# Patient Record
Sex: Male | Born: 1970 | Hispanic: Yes | Marital: Married | Smoking: Never smoker
Health system: Southern US, Community
[De-identification: ages and names within clinical notes are randomized; demographics above are authoritative.]

## PROBLEM LIST (undated history)

## (undated) DIAGNOSIS — M109 Gout, unspecified: Secondary | ICD-10-CM

## (undated) DIAGNOSIS — N200 Calculus of kidney: Secondary | ICD-10-CM

## (undated) DIAGNOSIS — J3081 Allergic rhinitis due to animal (cat) (dog) hair and dander: Secondary | ICD-10-CM

---

## 2018-06-22 ENCOUNTER — Emergency Department (HOSPITAL_BASED_OUTPATIENT_CLINIC_OR_DEPARTMENT_OTHER)
Admission: EM | Admit: 2018-06-22 | Discharge: 2018-06-22 | Disposition: A | Discharge status: Home / Self Care | Payer: Self-pay | Attending: Emergency Medicine | Admitting: Emergency Medicine

## 2018-06-22 ENCOUNTER — Encounter (HOSPITAL_BASED_OUTPATIENT_CLINIC_OR_DEPARTMENT_OTHER): Payer: Self-pay

## 2018-06-22 ENCOUNTER — Emergency Department (HOSPITAL_BASED_OUTPATIENT_CLINIC_OR_DEPARTMENT_OTHER): Payer: Self-pay

## 2018-06-22 DIAGNOSIS — N201 Calculus of ureter: Secondary | ICD-10-CM

## 2018-06-22 DIAGNOSIS — Z87442 Personal history of urinary calculi: Secondary | ICD-10-CM | POA: Insufficient documentation

## 2018-06-22 DIAGNOSIS — K760 Fatty (change of) liver, not elsewhere classified: Secondary | ICD-10-CM | POA: Insufficient documentation

## 2018-06-22 DIAGNOSIS — N132 Hydronephrosis with renal and ureteral calculous obstruction: Secondary | ICD-10-CM | POA: Insufficient documentation

## 2018-06-22 HISTORY — DX: Calculus of kidney: N20.0

## 2018-06-22 HISTORY — DX: Gout, unspecified: M10.9

## 2018-06-22 LAB — COMPREHENSIVE METABOLIC PANEL
ALK PHOS: 74 U/L (ref 38–126)
ALT: 27 U/L (ref 0–44)
AST: 27 U/L (ref 15–41)
Albumin: 4.2 g/dL (ref 3.5–5.0)
Anion gap: 11 (ref 5–15)
BILIRUBIN TOTAL: 0.7 mg/dL (ref 0.3–1.2)
BUN: 16 mg/dL (ref 6–20)
CALCIUM: 9.4 mg/dL (ref 8.9–10.3)
CO2: 25 mmol/L (ref 22–32)
CREATININE: 1.33 mg/dL — AB (ref 0.61–1.24)
Chloride: 104 mmol/L (ref 98–111)
Glucose, Bld: 125 mg/dL — ABNORMAL HIGH (ref 70–99)
Potassium: 4.1 mmol/L (ref 3.5–5.1)
Sodium: 140 mmol/L (ref 135–145)
Total Protein: 7.6 g/dL (ref 6.5–8.1)

## 2018-06-22 LAB — URINALYSIS, ROUTINE W REFLEX MICROSCOPIC
BILIRUBIN URINE: NEGATIVE
Glucose, UA: NEGATIVE mg/dL
KETONES UR: NEGATIVE mg/dL
Leukocytes, UA: NEGATIVE
Nitrite: NEGATIVE
Protein, ur: NEGATIVE mg/dL
SPECIFIC GRAVITY, URINE: 1.025 (ref 1.005–1.030)
pH: 5.5 (ref 5.0–8.0)

## 2018-06-22 LAB — CBC WITH DIFFERENTIAL/PLATELET
BASOS ABS: 0 10*3/uL (ref 0.0–0.1)
Basophils Relative: 0 %
EOS PCT: 2 %
Eosinophils Absolute: 0.2 10*3/uL (ref 0.0–0.7)
HEMATOCRIT: 47.1 % (ref 39.0–52.0)
HEMOGLOBIN: 16.7 g/dL (ref 13.0–17.0)
LYMPHS ABS: 1.1 10*3/uL (ref 0.7–4.0)
LYMPHS PCT: 14 %
MCH: 31 pg (ref 26.0–34.0)
MCHC: 35.5 g/dL (ref 30.0–36.0)
MCV: 87.4 fL (ref 78.0–100.0)
Monocytes Absolute: 0.7 10*3/uL (ref 0.1–1.0)
Monocytes Relative: 9 %
NEUTROS ABS: 6 10*3/uL (ref 1.7–7.7)
Neutrophils Relative %: 75 %
Platelets: 238 10*3/uL (ref 150–400)
RBC: 5.39 MIL/uL (ref 4.22–5.81)
RDW: 12.6 % (ref 11.5–15.5)
WBC: 8 10*3/uL (ref 4.0–10.5)

## 2018-06-22 LAB — URINALYSIS, MICROSCOPIC (REFLEX)

## 2018-06-22 MED ORDER — ONDANSETRON 4 MG PO TBDP: 4.0000 mg | ORAL_TABLET | Freq: Three times a day (TID) | ORAL | 0 refills | Status: DC | PRN

## 2018-06-22 MED ORDER — HYDROCODONE-ACETAMINOPHEN 5-325 MG PO TABS: 2.0000 | ORAL_TABLET | ORAL | 0 refills | Status: DC | PRN

## 2018-06-22 MED ORDER — ONDANSETRON HCL 4 MG/2ML IJ SOLN
4.0000 mg | Freq: Once | INTRAMUSCULAR | Status: DC
  Filled 2018-06-22: qty 2

## 2018-06-22 MED ORDER — OXYCODONE-ACETAMINOPHEN 5-325 MG PO TABS
1.0000 | ORAL_TABLET | Freq: Once | ORAL | Status: AC
  Administered 2018-06-22: 1 via ORAL
  Filled 2018-06-22: qty 1

## 2018-06-22 MED ORDER — TAMSULOSIN HCL 0.4 MG PO CAPS: 0.4000 mg | ORAL_CAPSULE | Freq: Every day | ORAL | 0 refills | Status: DC

## 2018-06-22 MED ORDER — ALLOPURINOL 300 MG PO TABS: 300.0000 mg | ORAL_TABLET | Freq: Every day | ORAL | 0 refills | Status: DC

## 2018-06-22 MED ORDER — SODIUM CHLORIDE 0.9 % IV BOLUS
1000.0000 mL | Freq: Once | INTRAVENOUS | Status: AC
  Administered 2018-06-22: 1000 mL via INTRAVENOUS

## 2018-06-22 MED FILL — ALLOPURINOL 300 MG TABLET: 300 | 30 days supply | Qty: 30 | Fill #0

## 2018-06-22 MED FILL — HYDROCODON-APAP 5-325: 5-325 | 2 days supply | Qty: 8 | Fill #0

## 2018-06-22 MED FILL — ONDANSETRON ODT 4 MG TABLET: 4 | 2 days supply | Qty: 6 | Fill #0

## 2018-06-22 MED FILL — TAMSULOSIN HCL 0.4 MG CAP: 0.4 | 14 days supply | Qty: 14 | Fill #0

## 2018-06-22 NOTE — ED Triage Notes (Signed)
Pt c/o rt flank pain with nausea x2hrs, hx of kidney stones

## 2018-06-22 NOTE — Discharge Instructions (Addendum)
You do have a 4.6 mm kidney stone the right side.  This is likely causing her pain.  Have given you Zofran for nausea.  Have given you stronger pain medication to take however be careful to take the medication.  He also takes Flomax daily.  Have discussed that you have concern for a fatty liver that needs to be followed up with your primary care doctor.  Your kidney function was mildly elevated.  Have this rechecked by your primary care doctor.  Drink plenty of fluids stay hydrated.  Follow-up with urologist for the kidney stone return the ED with any worsening symptoms.  You have been diagnosed with kidney stones.  Drink plenty of fluids to help you pass the stone.  Use your pain medication as directed and only as needed for severe pain. Taking flomax as directed will also help to pass the stone. Use Zofran for nausea as directed.  Follow up with the urology clinic listed in regards to your hospital visit.   Return to the ED immediately if you develop fever that persists > 101, uncontrolled pain or vomiting, or other concerns.   Do not drink alcohol, drive or participate in any other potentially dangerous activities while taking opiate pain medication as it may make you sleepy. Do not take this medication with any other sedating medications, either prescription or over-the-counter. If you were prescribed Percocet or Vicodin, do not take these with acetaminophen (Tylenol) as it is already contained within these medications.   This medication is an opiate (or narcotic) pain medication and can be habit forming.  Use it as little as possible to achieve adequate pain control.  Do not use or use it with extreme caution if you have a history of opiate abuse or dependence. This medication is intended for your use only - do not give any to anyone else and keep it in a secure place where nobody else, especially children, have access to it. It will also cause or worsen constipation, so you may want to consider  taking an over-the-counter stool softener while you are taking this medication.

## 2018-06-22 NOTE — ED Provider Notes (Signed)
[facility] EMERGENCY DEPARTMENT Provider Note   CSN: [id number] Arrival date & time: 06/22/18  8295     History   Chief Complaint Chief Complaint  Patient presents with  . Flank Pain    HPI Nathan Hahn is a 47 y.o. male.  HPI 47 year old male past medical history significant for gout, kidney stones presents to the emergency department today for evaluation of right flank pain.  Patient states her department 2 hours ago he woke up with pain to the right side.  He states that radiates to his right lower quadrant.  Patient states the pain is a sharp throbbing sensation.  He states the pain was initially a 6/10 and now has decreased to a 3/10.  The pain feels similar to prior kidney stone.  Patient reports nausea but denies any emesis.  Denies any fevers or chills.  Denies any urinary symptoms.  Denies any change in bowel habits.  He has not taken anything for his pain prior to arrival.  Nothing makes better or worse.  Last kidney stone was in 2018.  Pt denies any fever, chill, ha, vision changes, lightheadedness, dizziness, congestion, neck pain, cp, sob, cough,  urinary symptoms, change in bowel habits, melena, hematochezia, lower extremity paresthesias.  Past Medical History:  Diagnosis Date  . Gout   . Kidney stones     There are no active problems to display for this patient.   History reviewed. No pertinent surgical history.      Home Medications    Prior to Admission medications   Not on File    Family History No family history on file.  Social History Social History   Tobacco Use  . Smoking status: Never Smoker  . Smokeless tobacco: Never Used  Substance Use Topics  . Alcohol use: Not Currently  . Drug use: Not Currently     Allergies   Patient has no known allergies.   Review of Systems Review of Systems  All other systems reviewed and are negative.    Physical Exam Updated Vital Signs BP 132/76 (BP Location: Left Arm)   Pulse  62   Temp 97.6 F (36.4 C) (Oral)   Resp 18   Ht 5\' 5"  (1.651 m)   Wt 90.7 kg   SpO2 100%   BMI 33.28 kg/m   Physical Exam  Constitutional: He appears well-developed and well-nourished. No distress.  HENT:  Head: Normocephalic and atraumatic.  Eyes: Right eye exhibits no discharge. Left eye exhibits no discharge. No scleral icterus.  Neck: Normal range of motion.  Cardiovascular: Normal rate, regular rhythm and normal heart sounds.  Pulmonary/Chest: Effort normal and breath sounds normal. No respiratory distress.  Abdominal: Soft. Normal appearance and bowel sounds are normal. There is no tenderness. There is no rigidity, no rebound, no guarding, no CVA tenderness, no tenderness at McBurney's point and negative Murphy's sign.  Musculoskeletal: Normal range of motion.  Neurological: He is alert.  Skin: Skin is warm and dry. Capillary refill takes less than 2 seconds. No pallor.  Psychiatric: His behavior is normal. Judgment and thought content normal.  Nursing note and vitals reviewed.    ED Treatments / Results  Labs (all labs ordered are listed, but only abnormal results are displayed) Labs Reviewed  COMPREHENSIVE METABOLIC PANEL - Abnormal; Notable for the following components:      Result Value   Glucose, Bld 125 (*)    Creatinine, Ser 1.33 (*)    All other components within normal limits  URINALYSIS, ROUTINE W REFLEX MICROSCOPIC - Abnormal; Notable for the following components:   Hgb urine dipstick LARGE (*)    All other components within normal limits  URINALYSIS, MICROSCOPIC (REFLEX) - Abnormal; Notable for the following components:   Bacteria, UA FEW (*)    All other components within normal limits  CBC WITH DIFFERENTIAL/PLATELET    EKG None  Radiology No results found.  Procedures Procedures (including critical care time)  Medications Ordered in ED Medications  sodium chloride 0.9 % bolus 1,000 mL (1,000 mLs Intravenous New Bag/Given 06/22/18 1043)    ondansetron (ZOFRAN) injection 4 mg (4 mg Intravenous Refused 06/22/18 1044)     Initial Impression / Assessment and Plan / ED Course  I have reviewed the triage vital signs and the nursing notes.  Pertinent labs & imaging results that were available during my care of the patient were reviewed by me and considered in my medical decision making (see chart for details).     Patient presents the ED for evaluation of right flank pain with history of kidney stones.  Denies any other associated symptoms.  On exam patient overall well-appearing and nontoxic.  He states that his pain has subsided.  Vital signs are reassuring.  Patient is afebrile.  Exam reveals no focal abdominal tenderness or CVA tenderness.  Lab work shows no leukocytosis.  Mild elevation in creatinine of 1.3.  Otherwise lab work reassuring.  No significant elect light derangement and normal liver enzymes.  UA shows no concern for infection.  CT scan was obtained that shows a 4.6 mm distal kidney stone.  This is causing some mild hydronephrosis.  Also notes to nephrolithiasis in the left kidney and fatty liver.  Patient's pain managed in the ED.  He has refused any pain medicine or nausea medicine at this time.  Tolerating p.o. fluids.  He is nontoxic or septic appearing.  Vital signs remained reassuring.  Patient be prescribed pain medication, Zofran and Flomax for home use.  Will be given urology follow-up.  Patient has a history of gout has asked for refill of his allopurinol.  Discussed that patient has a mild elevation of his creatinine.  Have discussed with patient caution of significant moderate NSAIDs.  Discussed that patient is to follow-up with primary care doctor concerning other findings on CT scan.  Pt is hemodynamically stable, in NAD, & able to ambulate in the ED. Evaluation does not show pathology that would require ongoing emergent intervention or inpatient treatment. I explained the diagnosis to the patient. Pain has been  managed & has no complaints prior to dc. Pt is comfortable with above plan and is stable for discharge at this time. All questions were answered prior to disposition. Strict return precautions for f/u to the ED were discussed. Encouraged follow up with PCP.    Final Clinical Impressions(s) / ED Diagnoses   Final diagnoses:  Ureterolithiasis  Fatty liver    ED Discharge Orders         Ordered    ondansetron (ZOFRAN ODT) 4 MG disintegrating tablet  Every 8 hours PRN     06/22/18 1144    HYDROcodone-acetaminophen (NORCO/VICODIN) 5-325 MG tablet  Every 4 hours PRN     06/22/18 1144    tamsulosin (FLOMAX) 0.4 MG CAPS capsule  Daily after breakfast     06/22/18 1144    allopurinol (ZYLOPRIM) 300 MG tablet  Daily     06/22/18 1144           Denell Cothern,  Lynann Beaver, PA-C 06/22/18 1150    Sabas Sous, MD 06/22/18 1728

## 2018-09-10 ENCOUNTER — Encounter: Payer: Self-pay | Admitting: Emergency Medicine

## 2018-09-10 ENCOUNTER — Ambulatory Visit
Admission: EM | Admit: 2018-09-10 | Discharge: 2018-09-10 | Disposition: A | Discharge status: Home / Self Care | Payer: Self-pay | Attending: Emergency Medicine | Admitting: Emergency Medicine

## 2018-09-10 DIAGNOSIS — M10071 Idiopathic gout, right ankle and foot: Secondary | ICD-10-CM | POA: Insufficient documentation

## 2018-09-10 MED ORDER — INDOMETHACIN 50 MG PO CAPS: 50.0000 mg | ORAL_CAPSULE | Freq: Two times a day (BID) | ORAL | 0 refills | Status: DC

## 2018-09-10 MED ORDER — PREDNISONE 50 MG PO TABS: 50.0000 mg | ORAL_TABLET | Freq: Every day | ORAL | 0 refills | Status: DC

## 2018-09-10 NOTE — ED Provider Notes (Signed)
[name]    CSN: [id number] Arrival date & time: 09/10/18  1643     History   Chief Complaint Chief Complaint  Patient presents with  . Ankle Pain    HPI Marshell Dilauro is a 47 y.o. male history of gout and kidney stones presenting today for evaluation of a Possible gout flare.  Patient has had right ankle pain for the past week.  It has been painful, swollen and warm.  Denies any injury.  Feels similar to previous flare.  He had a flare approximately 1 month ago symptoms pretty much resolved, but he still had some minor tingling in his ankle.  He was taking indomethacin for this, ran out of this on Friday just prior to the symptoms starting.  He was prescribed a course of allopurinol, but he has not started this as his symptoms have not fully resolved.  Denies numbness or tingling.  HPI  Past Medical History:  Diagnosis Date  . Gout   . Kidney stones     There are no active problems to display for this patient.   History reviewed. No pertinent surgical history.     Home Medications    Prior to Admission medications   Medication Sig Start Date End Date Taking? Authorizing Provider  allopurinol (ZYLOPRIM) 300 MG tablet Take 1 tablet (300 mg total) by mouth daily. 06/22/18   Rise Mu, PA-C  HYDROcodone-acetaminophen (NORCO/VICODIN) 5-325 MG tablet Take 2 tablets by mouth every 4 (four) hours as needed. 06/22/18   Rise Mu, PA-C  indomethacin (INDOCIN) 50 MG capsule Take 1 capsule (50 mg total) by mouth 2 (two) times daily with a meal. 09/10/18   Wieters, Hallie C, PA-C  predniSONE (DELTASONE) 50 MG tablet Take 1 tablet (50 mg total) by mouth daily. With food 09/10/18   Wieters, Junius Creamer, PA-C    Family History History reviewed. No pertinent family history.  Social History Social History   Tobacco Use  . Smoking status: Never Smoker  . Smokeless tobacco: Never Used  Substance Use Topics  . Alcohol use: Not Currently  . Drug  use: Not Currently     Allergies   Patient has no known allergies.   Review of Systems Review of Systems  Constitutional: Negative for fatigue and fever.  Eyes: Negative for redness, itching and visual disturbance.  Respiratory: Negative for shortness of breath.   Cardiovascular: Negative for chest pain and leg swelling.  Gastrointestinal: Negative for nausea and vomiting.  Musculoskeletal: Positive for arthralgias. Negative for myalgias.  Skin: Positive for color change. Negative for rash and wound.  Neurological: Negative for dizziness, syncope, weakness, light-headedness and headaches.     Physical Exam Triage Vital Signs ED Triage Vitals  Enc Vitals Group     BP 09/10/18 1657 119/70     Pulse Rate 09/10/18 1657 95     Resp 09/10/18 1657 18     Temp 09/10/18 1657 98.6 F (37 C)     Temp Source 09/10/18 1657 Oral     SpO2 09/10/18 1657 96 %     Weight --      Height --      Head Circumference --      Peak Flow --      Pain Score 09/10/18 1658 7     Pain Loc --      Pain Edu? --      Excl. in GC? --    No data found.  Updated Vital Signs BP  119/70 (BP Location: Left Arm)   Pulse 95   Temp 98.6 F (37 C) (Oral)   Resp 18   SpO2 96%   Visual Acuity Right Eye Distance:   Left Eye Distance:   Bilateral Distance:    Right Eye Near:   Left Eye Near:    Bilateral Near:     Physical Exam Vitals signs and nursing note reviewed.  Constitutional:      Appearance: He is well-developed.     Comments: No acute distress  HENT:     Head: Normocephalic and atraumatic.     Nose: Nose normal.  Eyes:     Conjunctiva/sclera: Conjunctivae normal.  Neck:     Musculoskeletal: Neck supple.  Cardiovascular:     Rate and Rhythm: Normal rate.  Pulmonary:     Effort: Pulmonary effort is normal. No respiratory distress.  Abdominal:     General: There is no distension.  Musculoskeletal: Normal range of motion.     Comments: Right ankle: Swollen, warm to touch, mild  erythema, relatively full range of motion, slightly limited due to pain, dorsalis pedis 2+, sensation intact distally  No calf swelling erythema or tenderness, negative Homans  Skin:    General: Skin is warm and dry.  Neurological:     Mental Status: He is alert and oriented to person, place, and time.      UC Treatments / Results  Labs (all labs ordered are listed, but only abnormal results are displayed) Labs Reviewed - No data to display  EKG None  Radiology No results found.  Procedures Procedures (including critical care time)  Medications Ordered in UC Medications - No data to display  Initial Impression / Assessment and Plan / UC Course  I have reviewed the triage vital signs and the nursing notes.  Pertinent labs & imaging results that were available during my care of the patient were reviewed by me and considered in my medical decision making (see chart for details).     Patient with likely gout flare.  Given patient has been using indomethacin to treat his gout without full resolution will provide prednisone as alternative.  Will refill indomethacin to use supplementally as needed.  Discussed increased risk of GI bleed with prednisone and indomethacin use together.  Recommended to take with food.  May begin allopurinol when symptoms fully resolved.  Continue to monitor,Discussed strict return precautions. Patient verbalized understanding and is agreeable with plan.  Final Clinical Impressions(s) / UC Diagnoses   Final diagnoses:  Acute idiopathic gout of right ankle     Discharge Instructions     Please begin taking prednisone daily for the next 5 days; take in the morning with food You may supplement with indomethacin up to twice daily, please also take with food on her stomach Taking both of these medicines together may increase the risk of GI bleed You may start allopurinol after symptoms resolve Please follow-up if pain, swelling, redness not resolving  with treatment   ED Prescriptions    Medication Sig Dispense Auth. Provider   predniSONE (DELTASONE) 50 MG tablet  (Status: Discontinued) Take 1 tablet (50 mg total) by mouth daily. With food 5 tablet Wieters, Hallie C, PA-C   indomethacin (INDOCIN) 50 MG capsule  (Status: Discontinued) Take 1 capsule (50 mg total) by mouth 2 (two) times daily with a meal. 90 capsule Wieters, Hallie C, PA-C   indomethacin (INDOCIN) 50 MG capsule Take 1 capsule (50 mg total) by mouth 2 (two) times daily  with a meal. 90 capsule Wieters, Hallie C, PA-C   predniSONE (DELTASONE) 50 MG tablet Take 1 tablet (50 mg total) by mouth daily. With food 5 tablet Wieters, ChaunceyHallie C, PA-C     Controlled Substance Prescriptions [place] Controlled Substance Registry consulted? Not Applicable   Lew DawesWieters, Hallie C, [place]PA-C 09/10/18 1724

## 2018-09-10 NOTE — Discharge Instructions (Signed)
Please begin taking prednisone daily for the next 5 days; take in the morning with food You may supplement with indomethacin up to twice daily, please also take with food on her stomach Taking both of these medicines together may increase the risk of GI bleed You may start allopurinol after symptoms resolve Please follow-up if pain, swelling, redness not resolving with treatment

## 2018-09-10 NOTE — ED Notes (Signed)
Patient able to ambulate independently, gait steady but limping

## 2018-09-10 NOTE — ED Triage Notes (Signed)
[facility], redness and pain to the right ankle.  Pt has been on preventative for Gout but ran out Friday.

## 2019-05-22 ENCOUNTER — Other Ambulatory Visit: Payer: Self-pay

## 2019-05-22 ENCOUNTER — Encounter (HOSPITAL_COMMUNITY): Payer: Self-pay | Admitting: Emergency Medicine

## 2019-05-22 ENCOUNTER — Emergency Department (HOSPITAL_COMMUNITY)
Admission: EM | Admit: 2019-05-22 | Discharge: 2019-05-23 | Disposition: A | Discharge status: Home / Self Care | Payer: BC Managed Care – PPO | Attending: Emergency Medicine | Admitting: Emergency Medicine

## 2019-05-22 DIAGNOSIS — Z5321 Procedure and treatment not carried out due to patient leaving prior to being seen by health care provider: Secondary | ICD-10-CM | POA: Insufficient documentation

## 2019-05-22 DIAGNOSIS — M25561 Pain in right knee: Secondary | ICD-10-CM | POA: Insufficient documentation

## 2019-05-22 NOTE — ED Triage Notes (Signed)
Pt presents with right knee pain that started 2 days ago pt denies any injury concerned for gout in knee.

## 2019-05-23 NOTE — ED Notes (Signed)
No answer when called for room 

## 2019-09-26 IMAGING — CT CT RENAL STONE PROTOCOL
2 of 4 series · 16 of 46 positions shown, 18 images · non-contrast
Comparison: None.

CLINICAL DATA: 47-year-old male with right flank pain and nausea
for 2 hours. History kidney stones. Initial encounter.

EXAM:
CT ABDOMEN AND PELVIS WITHOUT CONTRAST
TECHNIQUE: Multidetector CT imaging of the abdomen and pelvis was performed
following the standard protocol without IV contrast.

[Series 2: axial st · axial · 0.91mm/px · z∈[-628,-158]mm · 13 of 104 slices shown, 15 images]
[im 5/104  soft-tissue]
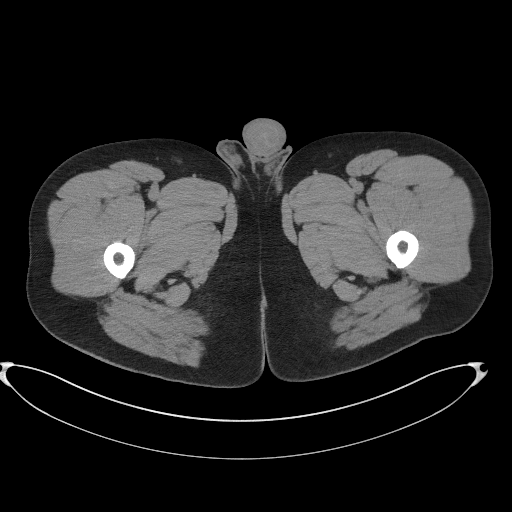
[im 5/104  bone]
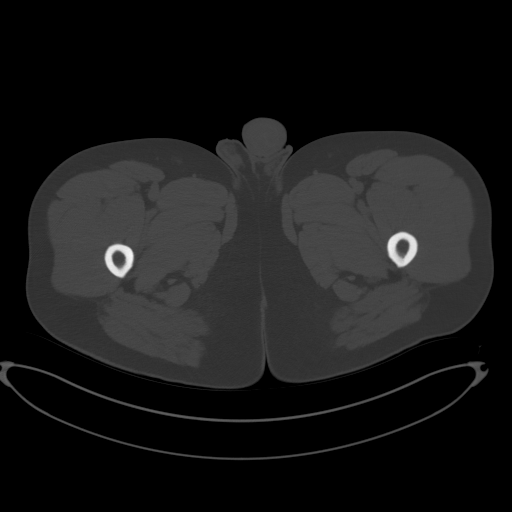
[im 13/104  soft-tissue]
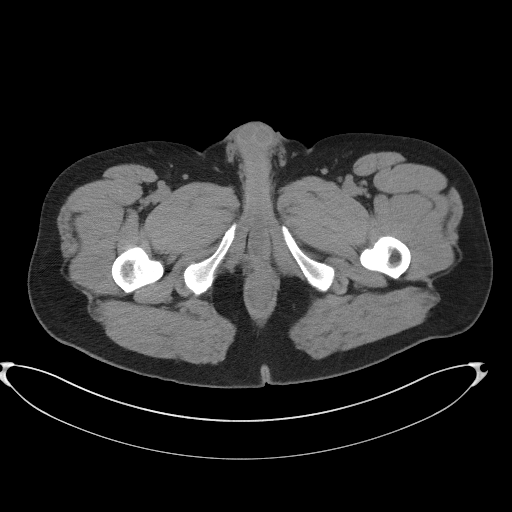
[im 22/104  soft-tissue]
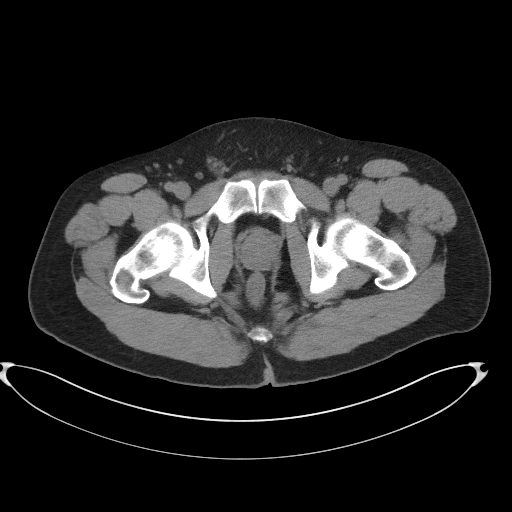
[im 31/104  soft-tissue]
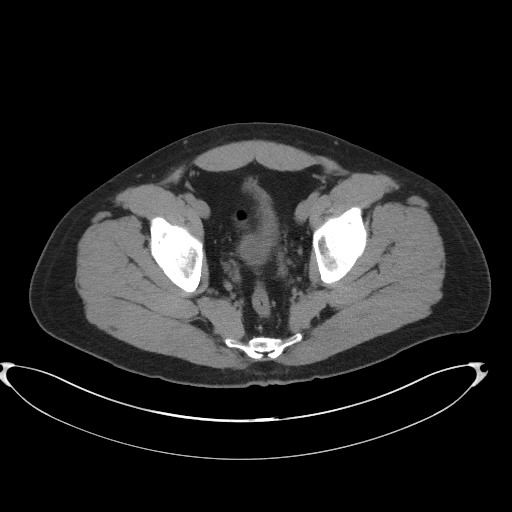
[im 35/104  soft-tissue]
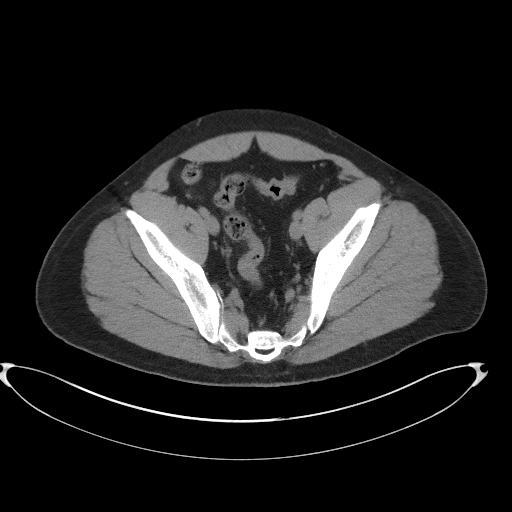
[im 43/104  soft-tissue]
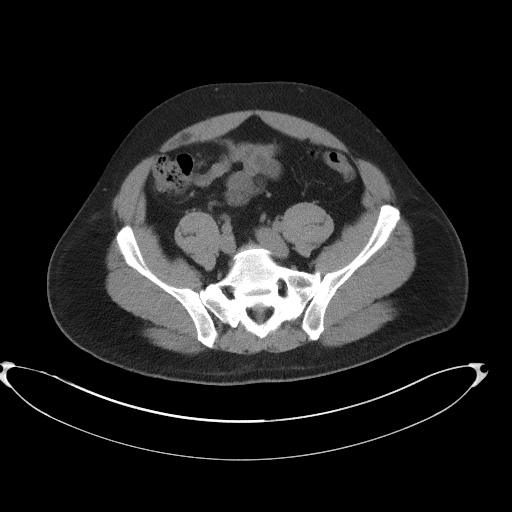
[im 52/104  soft-tissue]
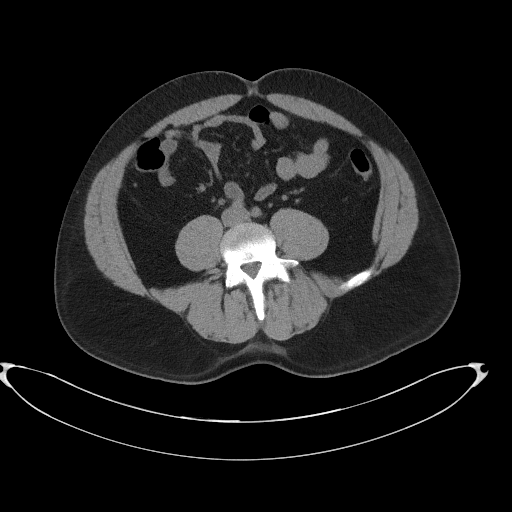
[im 61/104  soft-tissue]
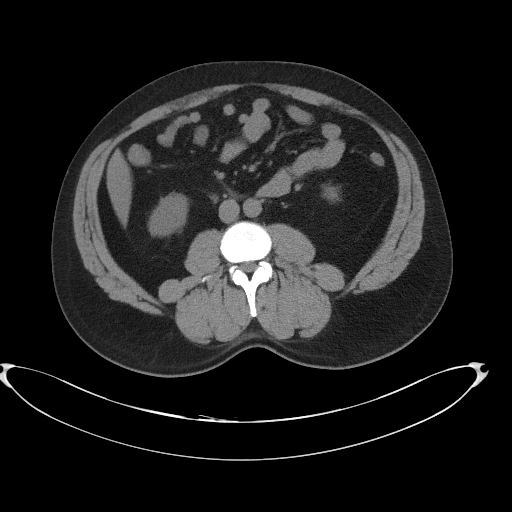
[im 69/104  soft-tissue]
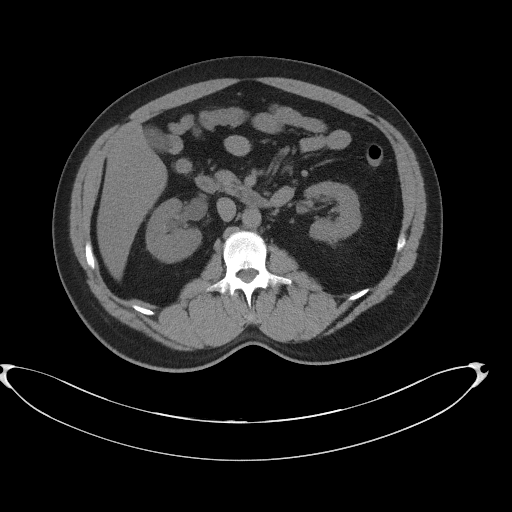
[im 69/104  bone]
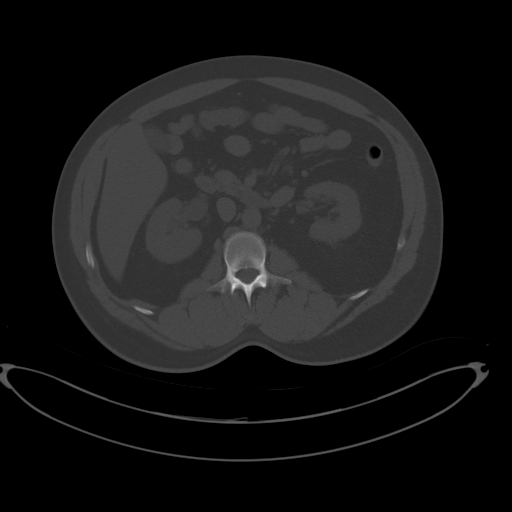
[im 73/104  soft-tissue]
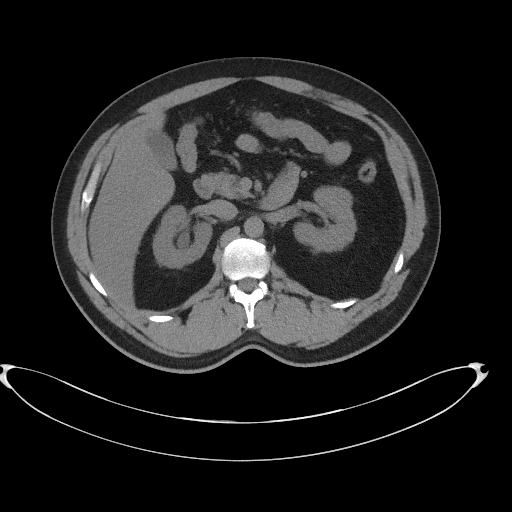
[im 82/104  soft-tissue]
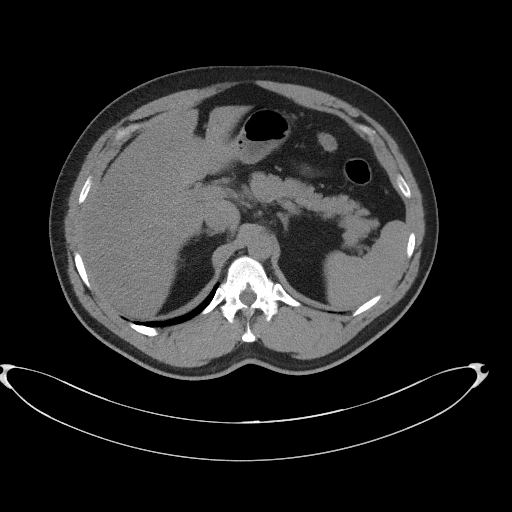
[im 91/104  soft-tissue]
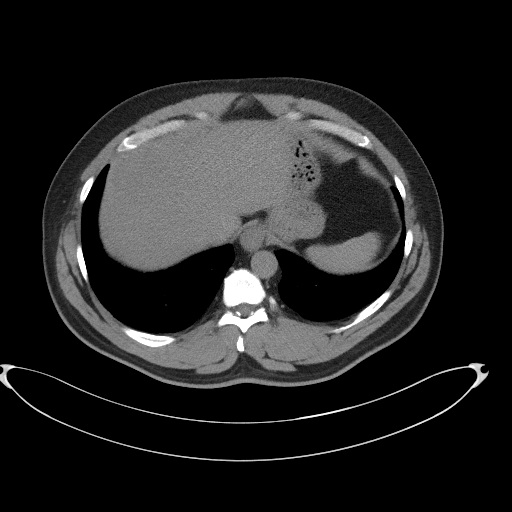
[im 99/104  soft-tissue]
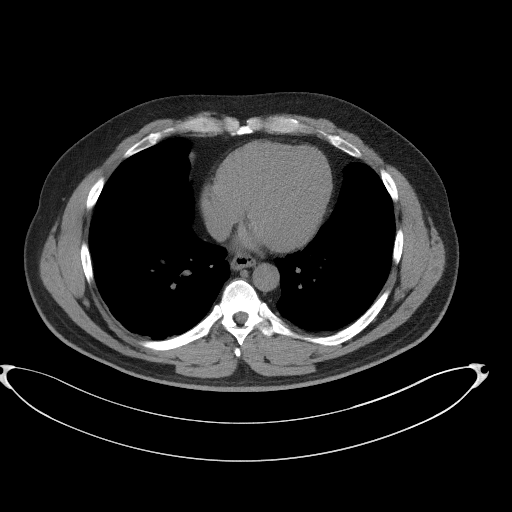

[Series 4: coronal st · coronal · 0.92mm/px · 3 of 112 slices shown]
[im 38/112  soft-tissue]
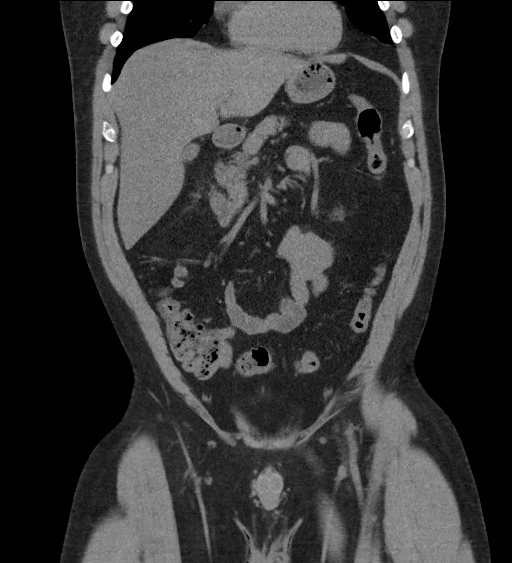
[im 50/112  soft-tissue]
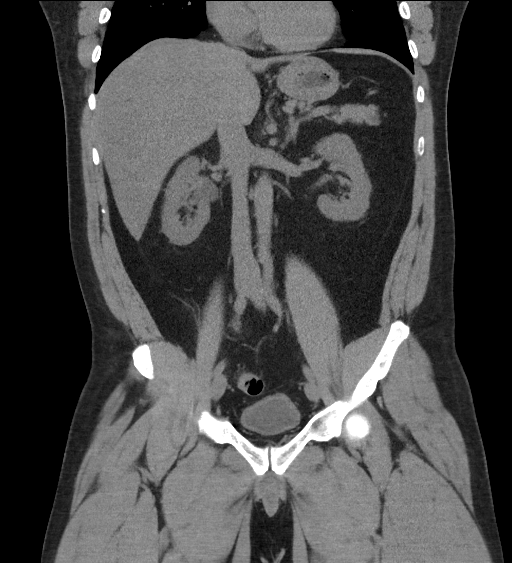
[im 62/112  soft-tissue]
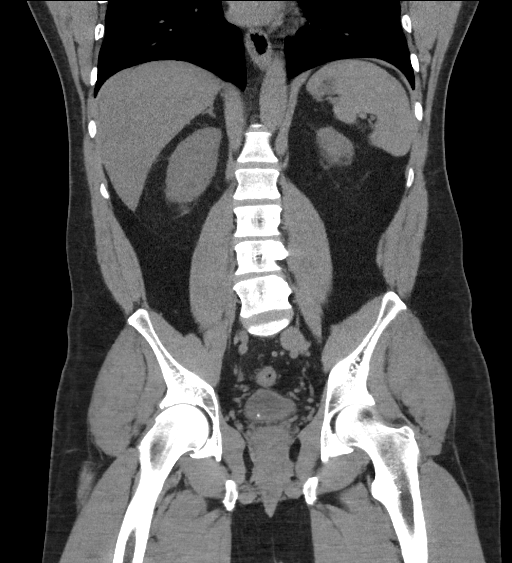

[16 of 46 positions shown; findings below may reference images not displayed]

FINDINGS: Lower chest: Minimal basilar atelectasis. Heart size within normal
limits.

Hepatobiliary: Enlarged fatty liver spanning over 19.8 cm. Taking
into account limitation by non contrast imaging, no worrisome
hepatic lesion. Gallbladder sludge without calcified gallstone. No
CT evidence of gallbladder inflammation.

Pancreas: Taking into account limitation by non contrast imaging, no
worrisome pancreatic mass or inflammation.

Spleen: Taking into account limitation by non contrast imaging, no
splenic mass or enlargement.

Adrenals/Urinary Tract: 4.6 mm right ureteral vesicle junction
obstructing stone with moderate right-sided hydroureteronephrosis. 2
mm nonobstructing left lower pole renal calculus. Taking into
account limitation by non contrast imaging, no worrisome renal,
urinary bladder or adrenal lesion.

Stomach/Bowel: No extraluminal bowel inflammatory process.
Evaluation of distal esophagus, stomach, portions of small and large
bowel limited by under distension.

Vascular/Lymphatic: No abdominal aortic aneurysm. Scattered normal
size lymph nodes.

Reproductive: Negative

Other: No free air or bowel containing hernia.

Musculoskeletal: No worrisome osseous lesion. Transitional vertebra
lumbosacral junction. Slight curvature lower lumbar spine convex
right.
IMPRESSION: 1. 4.6 mm right ureteral vesicle junction obstructing stone with
moderate right-sided hydroureteronephrosis.
2. 2 mm nonobstructing left lower pole renal calculus.
3. Enlarged fatty liver spanning over 19.8 cm.

## 2020-10-30 ENCOUNTER — Other Ambulatory Visit: Payer: Self-pay

## 2020-10-30 ENCOUNTER — Ambulatory Visit
Admission: EM | Admit: 2020-10-30 | Discharge: 2020-10-30 | Disposition: A | Discharge status: Home / Self Care | Payer: BC Managed Care – PPO | Attending: Family Medicine | Admitting: Family Medicine

## 2020-10-30 DIAGNOSIS — G510 Bell's palsy: Secondary | ICD-10-CM

## 2020-10-30 MED ORDER — PREDNISONE 20 MG PO TABS: ORAL_TABLET | ORAL | 0 refills | Status: DC

## 2020-10-30 MED ORDER — CVS LUBRICANT EYE DROPS 0.25 % OP SOLN: 2.0000 [drp] | OPHTHALMIC | 0 refills | Status: DC | PRN

## 2020-10-30 NOTE — ED Provider Notes (Signed)
[name]    CSN: [id number] Arrival date & time: 10/30/20  0858      History   Chief Complaint Chief Complaint  Patient presents with  . Headache  . Numbness    Right side face    HPI Nathan Hahn is a 50 y.o. male.   HPI  Patient presents today for evaluation of right-sided facial weakness.  Patient reports 2 weeks ago he developed left ear tinnitus which was subsequently followed by progressively worsening facial weakness only involving the right side of his face.  He initially noticed increased effort to perform any facial movements on the right side of his face.  He is also had mild headache which he describes as head pressure type pain involving the right side of the face most prominently behind the ear.  Today he attempted to whistle and noticed asymmetry prominently involving the lips and also lid lag with blinking.  He endorses a recent stressor as he is working a combination of first shift and night shift on his current job and he is unable to sleep appropriately since this change occurred approximately 4 weeks ago.  He has not recently been ill.  His blood pressure is elevated on arrival today and he has no history of hypertension.  He currently is not prescribed any hypertensive medications.  Denies any chest pain, dizziness or weakness.  He has absence of any symptoms involving the left side of his body or face.  He denies any left lower extremity weakness.  Past Medical History:  Diagnosis Date  . Gout   . Kidney stones     There are no problems to display for this patient.   History reviewed. No pertinent surgical history.     Home Medications    Prior to Admission medications   Medication Sig Start Date End Date Taking? Authorizing Provider  allopurinol (ZYLOPRIM) 300 MG tablet Take 1 tablet (300 mg total) by mouth daily. 06/22/18  Yes Leaphart, Lynann Beaver, PA-C  Carboxymethylcellulose Sodium ([employer] LUBRICANT EYE DROPS) 0.25 % SOLN Apply 2 drops  to eye every 3 (three) hours as needed (every night prior to bedtime with patching untils symptoms resolve). 10/30/20  Yes Bing Neighbors, FNP  indomethacin (INDOCIN) 50 MG capsule Take 1 capsule (50 mg total) by mouth 2 (two) times daily with a meal. 09/10/18  Yes Wieters, Hallie C, PA-C  predniSONE (DELTASONE) 20 MG tablet Take 3 PO QAM x 5 days, 2 PO QAM x 2 days, 1 PO QAM x 3 days 10/30/20  Yes Bing Neighbors, FNP  HYDROcodone-acetaminophen (NORCO/VICODIN) 5-325 MG tablet Take 2 tablets by mouth every 4 (four) hours as needed. 06/22/18   Rise Mu, PA-C    Family History No family history on file.  Social History Social History   Tobacco Use  . Smoking status: Never Smoker  . Smokeless tobacco: Never Used  Vaping Use  . Vaping Use: Never used  Substance Use Topics  . Alcohol use: Not Currently  . Drug use: Not Currently     Allergies   Patient has no known allergies.   Review of Systems Review of Systems Pertinent negatives listed in HPI   Physical Exam Triage Vital Signs ED Triage Vitals  Enc Vitals Group     BP 10/30/20 0918 (!) 162/94     Pulse Rate 10/30/20 0918 84     Resp 10/30/20 0918 18     Temp --      Temp Source 10/30/20  0539 Oral     SpO2 10/30/20 0918 97 %     Weight 10/30/20 0916 205 lb (93 kg)     Height 10/30/20 0916 5\' 6"  (1.676 m)     Head Circumference --      Peak Flow --      Pain Score 10/30/20 0915 2     Pain Loc --      Pain Edu? --      Excl. in [place]? --    No data found.  Updated Vital Signs BP (!) 162/94 (BP Location: Right Arm)   Pulse 84   Resp 18   Ht 5\' 6"  (1.676 m)   Wt 205 lb (93 kg)   SpO2 97%   BMI 33.09 kg/m   Visual Acuity Right Eye Distance:   Left Eye Distance:   Bilateral Distance:    Right Eye Near:   Left Eye Near:    Bilateral Near:     Physical Exam Constitutional:      Appearance: He is obese. He is not ill-appearing or toxic-appearing.  HENT:     Head: Normocephalic.   Cardiovascular:     Rate and Rhythm: Normal rate and regular rhythm.  Pulmonary:     Effort: Pulmonary effort is normal.     Breath sounds: Normal breath sounds and air entry.  Neurological:     Mental Status: He is alert.     GCS: GCS eye subscore is 4. GCS verbal subscore is 5. GCS motor subscore is 6.     Cranial Nerves: Facial asymmetry present.     Sensory: Sensation is intact.     Coordination: Coordination is intact.     Gait: Gait is intact.     Comments: Right-sided notable facial weakness involving the drooping of the right side of face, right eye leg evident with delays in blinking right eye, smile is asymmetrical, puckering of lips is asymmetrical Left facial features grossly intact.  Psychiatric:        Attention and Perception: Attention normal.        Mood and Affect: Mood normal.        Speech: Speech normal.        Cognition and Memory: Cognition normal.        Judgment: Judgment normal.      UC Treatments / Results  Labs (all labs ordered are listed, but only abnormal results are displayed) Labs Reviewed - No data to display  EKG   Radiology No results found.  Procedures Procedures (including critical care time)  Medications Ordered in UC Medications - No data to display  Initial Impression / Assessment and Plan / UC Course  I have reviewed the triage vital signs and the nursing notes.  Pertinent labs & imaging results that were available during my care of the patient were reviewed by me and considered in my medical decision making (see chart for details).    Exam findings today are consistent with that of Bell's palsy.  Patient describes recent disruption in sleep pattern related to changes at work which is a stressor which could be related to acute episode of Bell's palsy. Bell's palsy affecting right side of face otherwise patient's neurological status is grossly intact.  Will treat with a steroid taper expanding over 10 days.  Patient also  advised to obtain eye lubricant as he has significant I will lid lag which can precipitate drying of the eye and irritation in the eye.  Also advised to purchase a  eye patch as we had none available today in office.  Patient's blood pressure was also elevated however given limited history unsure if this is a new finding.  Patient was advised to monitor blood pressure readings at home and if readings are consistently above 130/90 to follow-up with primary care for evaluation of hypertension.  Red flag precautions given warranting immediately evaluation in the ER.  Patient verbalized understanding and agreement with plan. Final Clinical Impressions(s) / UC Diagnoses   Final diagnoses:  Bell's palsy     Discharge Instructions     Purchase a eyepatch for your right eyelid to wear at bedtime.  Your all muscles are weak due to Bell's palsy wearing the eye patch will help protect and keep the eye moisturized during the nighttime.  Prior to applying I had instilled liquid lubricant into right eye.  During the day if he experienced any itchiness or irritation of the right eye you also may instill eye lubricant every 3 hours as needed.  Take prednisone as prescribed.  Complete the entire course of medication.  You will notice slight improvement in facial muscles as you can progress through the course of the prednisone. If anytime any of your symptoms worsen or you are unable to feel any sensation on the right side of the face this is a medical emergency and warrants immediate evaluation in the emergency department.  In the meantime I would like for you to continue to monitor your blood pressure at home it is slightly elevated today.  A normal blood pressure reading would be less than 130/90.   ED Prescriptions    Medication Sig Dispense Auth. Provider   predniSONE (DELTASONE) 20 MG tablet Take 3 PO QAM x 5 days, 2 PO QAM x 2 days, 1 PO QAM x 3 days 22 tablet Bing Neighbors, FNP   Carboxymethylcellulose  Sodium ([employer] LUBRICANT EYE DROPS) 0.25 % SOLN Apply 2 drops to eye every 3 (three) hours as needed (every night prior to bedtime with patching untils symptoms resolve). 30 mL Bing Neighbors, FNP     PDMP not reviewed this encounter.   Bing Neighbors, FNP 10/30/20 1017

## 2020-10-30 NOTE — ED Triage Notes (Signed)
Pt c/o numbness in lips for several days. Pt also reports constant ringing in the right ear. Pt states he has begun to have difficulty with the right side of his face, such as smiling and winking. Pt does report headache to the righ side of head above and behind ear. Pt has taken Ibuprofen with a little relief in headache but no relief of other symptoms.

## 2020-10-30 NOTE — Discharge Instructions (Signed)
Purchase a eyepatch for your right eyelid to wear at bedtime.  Your all muscles are weak due to Bell's palsy wearing the eye patch will help protect and keep the eye moisturized during the nighttime.  Prior to applying I had instilled liquid lubricant into right eye.  During the day if he experienced any itchiness or irritation of the right eye you also may instill eye lubricant every 3 hours as needed.  Take prednisone as prescribed.  Complete the entire course of medication.  You will notice slight improvement in facial muscles as you can progress through the course of the prednisone. If anytime any of your symptoms worsen or you are unable to feel any sensation on the right side of the face this is a medical emergency and warrants immediate evaluation in the emergency department.  In the meantime I would like for you to continue to monitor your blood pressure at home it is slightly elevated today.  A normal blood pressure reading would be less than 130/90.

## 2021-01-15 ENCOUNTER — Encounter: Payer: Self-pay | Admitting: Emergency Medicine

## 2021-01-15 ENCOUNTER — Other Ambulatory Visit: Payer: Self-pay

## 2021-01-15 ENCOUNTER — Ambulatory Visit
Admission: EM | Admit: 2021-01-15 | Discharge: 2021-01-15 | Disposition: A | Discharge status: Home / Self Care | Payer: BC Managed Care – PPO | Attending: Emergency Medicine | Admitting: Emergency Medicine

## 2021-01-15 DIAGNOSIS — M25571 Pain in right ankle and joints of right foot: Secondary | ICD-10-CM

## 2021-01-15 DIAGNOSIS — M25561 Pain in right knee: Secondary | ICD-10-CM | POA: Diagnosis not present

## 2021-01-15 MED ORDER — PREDNISONE 10 MG PO TABS: ORAL_TABLET | ORAL | 0 refills | Status: DC

## 2021-01-15 NOTE — ED Triage Notes (Signed)
Pt presents with right knee and ankle pain xs 2 days. Denies fall or injury.

## 2021-01-15 NOTE — Discharge Instructions (Addendum)
Begin prednisone taper x6 days-begin with 6 tablets on day 1, decrease by 1 tablet each day until complete-6, 5, 4, 3, 2, 1-take with food and earlier in the day if possible Ice and elevate ankle/knee May wear Ace wrap for compression and comfort to help with swelling Follow-up if not improving or worsening

## 2021-01-15 NOTE — ED Provider Notes (Signed)
[name]    CSN: [id number] Arrival date & time: 01/15/21  0865      History   Chief Complaint Chief Complaint  Patient presents with  . Ankle Pain  . Knee Pain    Right    HPI Nathan Hahn is a 50 y.o. male presenting today for evaluation of knee and ankle pain.  Symptoms x2 days.  Denies any fall or injury.  Reports history of gout.  Reports mainly his right ankle has been bothering him which is since flared up right knee pain which she has intermittently.  Denies injury or fall.  Has been using ibuprofen without relief.  Reports he works standing long hours.  HPI  Past Medical History:  Diagnosis Date  . Gout   . Kidney stones     There are no problems to display for this patient.   History reviewed. No pertinent surgical history.     Home Medications    Prior to Admission medications   Medication Sig Start Date End Date Taking? Authorizing Provider  predniSONE (DELTASONE) 10 MG tablet Begin with 6 tabs on day 1, 5 tab on day 2, 4 tab on day 3, 3 tab on day 4, 2 tab on day 5, 1 tab on day 6-take with food 01/15/21  Yes Seaver Machia C, PA-C  allopurinol (ZYLOPRIM) 300 MG tablet Take 1 tablet (300 mg total) by mouth daily. 06/22/18   Rise Mu, PA-C  Carboxymethylcellulose Sodium ([employer] LUBRICANT EYE DROPS) 0.25 % SOLN Apply 2 drops to eye every 3 (three) hours as needed (every night prior to bedtime with patching untils symptoms resolve). 10/30/20   Bing Neighbors, FNP    Family History History reviewed. No pertinent family history.  Social History Social History   Tobacco Use  . Smoking status: Never Smoker  . Smokeless tobacco: Never Used  Vaping Use  . Vaping Use: Never used  Substance Use Topics  . Alcohol use: Not Currently  . Drug use: Not Currently     Allergies   Patient has no known allergies.   Review of Systems Review of Systems  Constitutional: Negative for fatigue and fever.  Eyes: Negative for redness,  itching and visual disturbance.  Respiratory: Negative for shortness of breath.   Cardiovascular: Negative for chest pain and leg swelling.  Gastrointestinal: Negative for nausea and vomiting.  Musculoskeletal: Positive for arthralgias and joint swelling. Negative for myalgias.  Skin: Negative for color change, rash and wound.  Neurological: Negative for dizziness, syncope, weakness, light-headedness and headaches.     Physical Exam Triage Vital Signs ED Triage Vitals  Enc Vitals Group     BP 01/15/21 0851 104/76     Pulse Rate 01/15/21 0851 73     Resp 01/15/21 0851 16     Temp 01/15/21 0851 98 F (36.7 C)     Temp Source 01/15/21 0851 Oral     SpO2 01/15/21 0851 96 %     Weight --      Height --      Head Circumference --      Peak Flow --      Pain Score 01/15/21 0848 5     Pain Loc --      Pain Edu? --      Excl. in GC? --    No data found.  Updated Vital Signs BP 104/76 (BP Location: Left Arm)   Pulse 73   Temp 98 F (36.7 C) (Oral)   Resp  16   SpO2 96%   Visual Acuity Right Eye Distance:   Left Eye Distance:   Bilateral Distance:    Right Eye Near:   Left Eye Near:    Bilateral Near:     Physical Exam Vitals and nursing note reviewed.  Constitutional:      Appearance: He is well-developed.     Comments: No acute distress  HENT:     Head: Normocephalic and atraumatic.     Nose: Nose normal.  Eyes:     Conjunctiva/sclera: Conjunctivae normal.  Cardiovascular:     Rate and Rhythm: Normal rate.  Pulmonary:     Effort: Pulmonary effort is normal. No respiratory distress.  Abdominal:     General: There is no distension.  Musculoskeletal:        General: Normal range of motion.     Cervical back: Neck supple.     Comments: Ambulating with cane  Right knee: No obvious swelling or deformity, tenderness to palpation to right lateral inferior area, nontender over patella or popliteal area, no tenderness extending into calf  Right ankle: No erythema,  warmth or discoloration, mild swelling noted about lateral malleolus, tender to palpation along anterior ankle, increasingly tender towards lateral malleolus and posteriorly around Achilles, Achilles feels firm and intact, no palpable deformity, negative Thompson's, nontender to medial malleolus or throughout dorsum of foot, dorsalis pedis 2+,  Skin:    General: Skin is warm and dry.  Neurological:     Mental Status: He is alert and oriented to person, place, and time.      UC Treatments / Results  Labs (all labs ordered are listed, but only abnormal results are displayed) Labs Reviewed - No data to display  EKG   Radiology No results found.  Procedures Procedures (including critical care time)  Medications Ordered in UC Medications - No data to display  Initial Impression / Assessment and Plan / UC Course  I have reviewed the triage vital signs and the nursing notes.  Pertinent labs & imaging results that were available during my care of the patient were reviewed by me and considered in my medical decision making (see chart for details).     Right ankle pain without injury or trauma, suspect likely overuse/inflammatory, this likely is also flaring knee pain.  Using NSAIDs without relief, will provide prednisone taper and Ace wrap to ankle.  Ice and elevate, rest x3 days from work.Discussed strict return precautions. Patient verbalized understanding and is agreeable with plan.  Final Clinical Impressions(s) / UC Diagnoses   Final diagnoses:  Acute right ankle pain  Acute pain of right knee     Discharge Instructions     Begin prednisone taper x6 days-begin with 6 tablets on day 1, decrease by 1 tablet each day until complete-6, 5, 4, 3, 2, 1-take with food and earlier in the day if possible Ice and elevate ankle/knee May wear Ace wrap for compression and comfort to help with swelling Follow-up if not improving or worsening     ED Prescriptions    Medication Sig  Dispense Auth. Provider   predniSONE (DELTASONE) 10 MG tablet Begin with 6 tabs on day 1, 5 tab on day 2, 4 tab on day 3, 3 tab on day 4, 2 tab on day 5, 1 tab on day 6-take with food 21 tablet Luvinia Lucy C, PA-C     PDMP not reviewed this encounter.   Lew Dawes, [place] 01/15/21 [phone]

## 2021-06-28 DIAGNOSIS — M19072 Primary osteoarthritis, left ankle and foot: Secondary | ICD-10-CM | POA: Insufficient documentation

## 2021-06-28 DIAGNOSIS — M109 Gout, unspecified: Secondary | ICD-10-CM | POA: Insufficient documentation

## 2021-06-28 DIAGNOSIS — M19079 Primary osteoarthritis, unspecified ankle and foot: Secondary | ICD-10-CM | POA: Insufficient documentation

## 2022-02-08 DIAGNOSIS — M25562 Pain in left knee: Secondary | ICD-10-CM | POA: Insufficient documentation

## 2022-07-04 ENCOUNTER — Encounter: Payer: Self-pay | Admitting: Emergency Medicine

## 2022-07-04 ENCOUNTER — Ambulatory Visit
Admission: EM | Admit: 2022-07-04 | Discharge: 2022-07-04 | Disposition: A | Discharge status: Home / Self Care | Payer: BLUE CROSS/BLUE SHIELD | Attending: Family Medicine | Admitting: Family Medicine

## 2022-07-04 ENCOUNTER — Other Ambulatory Visit: Payer: Self-pay

## 2022-07-04 DIAGNOSIS — M25572 Pain in left ankle and joints of left foot: Secondary | ICD-10-CM | POA: Diagnosis not present

## 2022-07-04 MED ORDER — PREDNISONE 20 MG PO TABS: 40.0000 mg | ORAL_TABLET | Freq: Every day | ORAL | 0 refills | Status: DC

## 2022-07-04 MED ORDER — INDOMETHACIN 50 MG PO CAPS: 50.0000 mg | ORAL_CAPSULE | Freq: Three times a day (TID) | ORAL | 0 refills | Status: DC

## 2022-07-04 NOTE — ED Triage Notes (Signed)
Pt sts left ankle pain x 2 days that he thinks is gout; with hx of same

## 2022-07-04 NOTE — ED Provider Notes (Signed)
  [place]   [id number] 07/04/22 Arrival Time: 7253  ASSESSMENT & PLAN:  1. Acute left ankle pain    Suspect gout. Begin: New Prescriptions   INDOMETHACIN (INDOCIN) 50 MG CAPSULE    Take 1 capsule (50 mg total) by mouth 3 (three) times daily with meals.   PREDNISONE (DELTASONE) 20 MG TABLET    Take 2 tablets (40 mg total) by mouth daily.   Activities as tolerated. Work/school excuse note: not needed.  Recommend:  Follow-up Information      [facility]gent Care at [facility] .   Specialty: Urgent Care Why: If worsening or failing to improve as anticipated. Contact information: [address] [place] [id number] [place] [address] [phone]               Reviewed expectations re: course of current medical issues. Questions answered. Outlined signs and symptoms indicating need for more acute intervention. Patient verbalized understanding. After Visit Summary given.  SUBJECTIVE: History from: patient. Nathan Hahn is a 51 y.o. male who reports LEFT ankle pain; x 2 days that he thinks is gout; with hx of same. No extremity sensation changes or weakness. No trauma/injury.  History reviewed. No pertinent surgical history.    OBJECTIVE:  Vitals:   07/04/22 1042  BP: 136/79  Pulse: 82  Resp: 18  Temp: 98.2 F (36.8 C)  TempSrc: Oral  SpO2: 95%    General appearance: alert; no distress HEENT: [place]; AT Neck: supple with FROM Resp: unlabored respirations Extremities: LLE: warm with well perfused appearance; mild TTP and swelling of L ankle; bruising: none; ankle ROM: limited by reported pain CV: brisk extremity capillary refill of LLE; 2+ DP of LLE. Skin: warm and dry; no visible rashes Neurologic: normal sensation and strength of LLE Psychological: alert and cooperative; normal mood and affect  Imaging: No results found.    No Known Allergies  Past Medical History:  Diagnosis Date   Gout    Kidney  stones    Social History   Socioeconomic History   Marital status: Married    Spouse name: Not on file   Number of children: Not on file   Years of education: Not on file   Highest education level: Not on file  Occupational History   Not on file  Tobacco Use   Smoking status: Never   Smokeless tobacco: Never  Vaping Use   Vaping Use: Never used  Substance and Sexual Activity   Alcohol use: Not Currently   Drug use: Not Currently   Sexual activity: Not on file  Other Topics Concern   Not on file  Social History Narrative   Not on file   Social Determinants of Health   Financial Resource Strain: Not on file  Food Insecurity: Not on file  Transportation Needs: Not on file  Physical Activity: Not on file  Stress: Not on file  Social Connections: Not on file   History reviewed. No pertinent family history. History reviewed. No pertinent surgical history.     Vanessa Kick, MD 07/04/22 1105

## 2022-11-14 ENCOUNTER — Ambulatory Visit
Admission: EM | Admit: 2022-11-14 | Discharge: 2022-11-14 | Disposition: A | Discharge status: Home / Self Care | Payer: BLUE CROSS/BLUE SHIELD | Attending: Family Medicine | Admitting: Family Medicine

## 2022-11-14 DIAGNOSIS — M10071 Idiopathic gout, right ankle and foot: Secondary | ICD-10-CM

## 2022-11-14 DIAGNOSIS — M25571 Pain in right ankle and joints of right foot: Secondary | ICD-10-CM | POA: Diagnosis not present

## 2022-11-14 MED ORDER — PREDNISONE 20 MG PO TABS: 40.0000 mg | ORAL_TABLET | Freq: Every day | ORAL | 0 refills | Status: DC

## 2022-11-14 MED ORDER — INDOMETHACIN 50 MG PO CAPS: 50.0000 mg | ORAL_CAPSULE | Freq: Three times a day (TID) | ORAL | 0 refills | Status: AC

## 2022-11-14 NOTE — ED Triage Notes (Signed)
Pt states right ankle pain for the past 3 days states it is his gout.  Pt has a boot on his right foot and is ambulating with a cane.

## 2022-11-14 NOTE — Discharge Instructions (Signed)
Take prednisone 20 mg--2 daily for 5 days  Take indomethacin 50 mg--1 capsule by mouth 3 times daily as needed for pain.  You can use the QR code/website at the back of the summary paperwork to schedule yourself a new patient appointment with primary care

## 2022-11-14 NOTE — ED Provider Notes (Signed)
[name]    CSN: [id number] Arrival date & time: 11/14/22  1130      History   Chief Complaint Chief Complaint  Patient presents with   Ankle Pain    HPI Nathan Hahn is a 52 y.o. male.    Ankle Pain  Here for pain in his right ankle.  He does have a history of gout and it feels like that overall.  It first hurt him about a week ago and then got better and he had more in his left great toe.  Then the toe was improved and now the right ankle is hurting more again.  It is swollen and having a little bit of warmth.  No fall or trauma.  He is wearing a boot today as that has helped in the past.  He is not taking allopurinol at present and does not have a pcp  Past Medical History:  Diagnosis Date   Gout    Kidney stones     There are no problems to display for this patient.   History reviewed. No pertinent surgical history.     Home Medications    Prior to Admission medications   Medication Sig Start Date End Date Taking? Authorizing Provider  indomethacin (INDOCIN) 50 MG capsule Take 1 capsule (50 mg total) by mouth 3 (three) times daily with meals. 11/14/22   Barrett Henle, MD  predniSONE (DELTASONE) 20 MG tablet Take 2 tablets (40 mg total) by mouth daily. 11/14/22   Barrett Henle, MD    Family History History reviewed. No pertinent family history.  Social History Social History   Tobacco Use   Smoking status: Never   Smokeless tobacco: Never  Vaping Use   Vaping Use: Never used  Substance Use Topics   Alcohol use: Not Currently   Drug use: Not Currently     Allergies   Patient has no known allergies.   Review of Systems Review of Systems   Physical Exam Triage Vital Signs ED Triage Vitals  Enc Vitals Group     BP 11/14/22 1152 (!) 159/91     Pulse Rate 11/14/22 1152 85     Resp 11/14/22 1152 16     Temp 11/14/22 1152 98.1 F (36.7 C)     Temp Source 11/14/22 1152 Oral     SpO2 11/14/22 1152 95 %     Weight  --      Height --      Head Circumference --      Peak Flow --      Pain Score 11/14/22 1153 7     Pain Loc --      Pain Edu? --      Excl. in [place]? --    No data found.  Updated Vital Signs BP (!) 159/91 (BP Location: Left Arm)   Pulse 85   Temp 98.1 F (36.7 C) (Oral)   Resp 16   SpO2 95%   Visual Acuity Right Eye Distance:   Left Eye Distance:   Bilateral Distance:    Right Eye Near:   Left Eye Near:    Bilateral Near:     Physical Exam Vitals reviewed.  Constitutional:      General: He is not in acute distress.    Appearance: He is not toxic-appearing.  HENT:     Mouth/Throat:     Mouth: Mucous membranes are moist.  Musculoskeletal:     Comments: There is mild swelling of the medial  and anterior right ankle and some warmth.  It is also tender.  Skin:    Coloration: Skin is not jaundiced or pale.  Neurological:     General: No focal deficit present.     Mental Status: He is oriented to person, place, and time.  Psychiatric:        Behavior: Behavior normal.      UC Treatments / Results  Labs (all labs ordered are listed, but only abnormal results are displayed) Labs Reviewed - No data to display  EKG   Radiology No results found.  Procedures Procedures (including critical care time)  Medications Ordered in UC Medications - No data to display  Initial Impression / Assessment and Plan / UC Course  I have reviewed the triage vital signs and the nursing notes.  Pertinent labs & imaging results that were available during my care of the patient were reviewed by me and considered in my medical decision making (see chart for details).        He states that indomethacin and prednisone usually work well and did not irritate his stomach.  Prescription sent today.  Information is given on how to self schedule a new patient appointment with primary care Final Clinical Impressions(s) / UC Diagnoses   Final diagnoses:  Acute right ankle pain  Acute  idiopathic gout of right ankle     Discharge Instructions      Take prednisone 20 mg--2 daily for 5 days  Take indomethacin 50 mg--1 capsule by mouth 3 times daily as needed for pain.  You can use the QR code/website at the back of the summary paperwork to schedule yourself a new patient appointment with primary care      ED Prescriptions     Medication Sig Dispense Auth. Provider   indomethacin (INDOCIN) 50 MG capsule Take 1 capsule (50 mg total) by mouth 3 (three) times daily with meals. 15 capsule Barrett Henle, MD   predniSONE (DELTASONE) 20 MG tablet Take 2 tablets (40 mg total) by mouth daily. 10 tablet Windy Carina Gwenlyn Perking, MD      PDMP not reviewed this encounter.   Barrett Henle, MD 11/14/22 [phone]

## 2022-12-27 ENCOUNTER — Ambulatory Visit
Admission: EM | Admit: 2022-12-27 | Discharge: 2022-12-27 | Disposition: A | Discharge status: Home / Self Care | Payer: BLUE CROSS/BLUE SHIELD | Attending: Physician Assistant | Admitting: Physician Assistant

## 2022-12-27 DIAGNOSIS — M109 Gout, unspecified: Secondary | ICD-10-CM | POA: Diagnosis not present

## 2022-12-27 DIAGNOSIS — M25571 Pain in right ankle and joints of right foot: Secondary | ICD-10-CM

## 2022-12-27 MED ORDER — COLCHICINE 0.6 MG PO TABS: ORAL_TABLET | ORAL | 0 refills | Status: AC

## 2022-12-27 MED ORDER — PREDNISONE 10 MG PO TABS: 10.0000 mg | ORAL_TABLET | Freq: Three times a day (TID) | ORAL | 0 refills | Status: DC

## 2022-12-27 MED ORDER — NAPROXEN 500 MG PO TABS: 500.0000 mg | ORAL_TABLET | Freq: Two times a day (BID) | ORAL | 0 refills | Status: AC

## 2022-12-27 NOTE — Discharge Instructions (Signed)
Advised to take the colchinine 0.6 mg, 2 tablets initially and then 1 additional tablet 1 hour later. Advised take the Naprosyn 500 mg every 12 hours with food to help reduce pain and swelling. Advised take prednisone 10 mg 1 tablet 3 times a day for 5 days only to help reduce the acute inflammatory process of the gout.  Advised to follow-up PCP return to urgent care if symptoms fail to improve.

## 2022-12-27 NOTE — ED Provider Notes (Signed)
[name]    CSN: [id number] Arrival date & time: 12/27/22  1423      History   Chief Complaint Chief Complaint  Patient presents with   Ankle Pain    HPI Nathan Hahn is a 52 y.o. male.   52 year old male presents with right ankle pain.  Patient indicates that yesterday he started having acute right ankle pain, tenderness, swelling on the outside of the ankle.  Patient indicates that the pain is progressed, worse with trying to walk on it where he has to use crutches and limp.  Patient indicates there is acute pain when he tries to touch the ankle.  Patient indicates he does have a history of gout and does have periodic flares throughout the year with the last episode being several months ago.  Patient indicates he has not injured the right ankle, fallen, or struck the ankle against anything.  Patient indicates he did on Wednesday several days ago slightly invert the ankle but he indicates that it was just very mild.  Patient indicates he has not been able to get relief with OTC type medications.  He is using his own crutches and a boot that he has had in the past to help relieve some of the pressure and pain from the right ankle.  He indicates there is no weakness, numbness or tingling.  He is without fever or chills.  He indicates this is usually how his gout feels when he has a flare.   Ankle Pain   Past Medical History:  Diagnosis Date   Gout    Kidney stones     There are no problems to display for this patient.   History reviewed. No pertinent surgical history.     Home Medications    Prior to Admission medications   Medication Sig Start Date End Date Taking? Authorizing Provider  colchicine 0.6 MG tablet Take 2 tablets initially and then 1 additional tablet 1 hour later. 12/27/22  Yes Ellsworth Lennox, PA-C  naproxen (NAPROSYN) 500 MG tablet Take 1 tablet (500 mg total) by mouth 2 (two) times daily. 12/27/22  Yes Ellsworth Lennox, PA-C  predniSONE (DELTASONE)  10 MG tablet Take 1 tablet (10 mg total) by mouth in the morning, at noon, and at bedtime. 12/27/22  Yes Ellsworth Lennox, PA-C  indomethacin (INDOCIN) 50 MG capsule Take 1 capsule (50 mg total) by mouth 3 (three) times daily with meals. 11/14/22   Zenia Resides, MD    Family History History reviewed. No pertinent family history.  Social History Social History   Tobacco Use   Smoking status: Never   Smokeless tobacco: Never  Vaping Use   Vaping Use: Never used  Substance Use Topics   Alcohol use: Not Currently   Drug use: Not Currently     Allergies   Patient has no known allergies.   Review of Systems Review of Systems  Musculoskeletal:  Positive for joint swelling (right ankle).     Physical Exam Triage Vital Signs ED Triage Vitals  Enc Vitals Group     BP 12/27/22 0839 122/76     Pulse Rate 12/27/22 0839 90     Resp 12/27/22 0839 16     Temp 12/27/22 0839 98.2 F (36.8 C)     Temp src --      SpO2 12/27/22 0839 98 %     Weight --      Height --      Head Circumference --  Peak Flow --      Pain Score 12/27/22 0838 8     Pain Loc --      Pain Edu? --      Excl. in GC? --    No data found.  Updated Vital Signs BP 122/76   Pulse 90   Temp 98.2 F (36.8 C)   Resp 16   SpO2 98%   Visual Acuity Right Eye Distance:   Left Eye Distance:   Bilateral Distance:    Right Eye Near:   Left Eye Near:    Bilateral Near:     Physical Exam Constitutional:      Appearance: Normal appearance.  Musculoskeletal:       Legs:     Comments: Right ankle: Lateral malleolus with 1+ swelling with minimal redness associated, minimal range of motion due to pain.  Stability is intact.  Achilles heel is normal.  Strength is normal.  Neurological:     Mental Status: He is alert.      UC Treatments / Results  Labs (all labs ordered are listed, but only abnormal results are displayed) Labs Reviewed - No data to display  EKG   Radiology No results  found.  Procedures Procedures (including critical care time)  Medications Ordered in UC Medications - No data to display  Initial Impression / Assessment and Plan / UC Course  I have reviewed the triage vital signs and the nursing notes.  Pertinent labs & imaging results that were available during my care of the patient were reviewed by me and considered in my medical decision making (see chart for details).    Plan: The diagnosis will be treated with the following: 1.  Acute right ankle pain: A.  Naprosyn 500 mg every 12 hours with food to help reduce pain and swelling. 2.  Acute gout right ankle: A. Colchinine .6mg  2 tablets initially and then 1 additional tablet 1 hour later. B.  Prednisone 10 mg, 1 tablet 3 times a day for 5 days only to help reduce the acute inflammatory response. 3.  Advised follow-up PCP return to urgent care as needed. Final Clinical Impressions(s) / UC Diagnoses   Final diagnoses:  Acute right ankle pain  Acute gout of right ankle, unspecified cause     Discharge Instructions      Advised to take the colchinine 0.6 mg, 2 tablets initially and then 1 additional tablet 1 hour later. Advised take the Naprosyn 500 mg every 12 hours with food to help reduce pain and swelling. Advised take prednisone 10 mg 1 tablet 3 times a day for 5 days only to help reduce the acute inflammatory process of the gout.  Advised to follow-up PCP return to urgent care if symptoms fail to improve.    ED Prescriptions     Medication Sig Dispense Auth. Provider   predniSONE (DELTASONE) 10 MG tablet Take 1 tablet (10 mg total) by mouth in the morning, at noon, and at bedtime. 15 tablet Ellsworth LennoxJames, Kade Rickels, PA-C   colchicine 0.6 MG tablet Take 2 tablets initially and then 1 additional tablet 1 hour later. 3 tablet Ellsworth LennoxJames, Keanen Dohse, PA-C   naproxen (NAPROSYN) 500 MG tablet Take 1 tablet (500 mg total) by mouth 2 (two) times daily. 30 tablet Ellsworth LennoxJames, Caydin Yeatts, PA-C      PDMP not reviewed  this encounter.   Ellsworth LennoxJames, Krzysztof Reichelt, PA-C 12/27/22 [phone]

## 2022-12-27 NOTE — ED Triage Notes (Signed)
[facility] presents to [facility] with co of right ankle pain since yesterday. Concerned for gout. Pmh of gout. Pt reports pain is 8/10 using crutches and boot.

## 2023-08-26 ENCOUNTER — Ambulatory Visit
Admission: RE | Admit: 2023-08-26 | Discharge: 2023-08-26 | Disposition: A | Discharge status: Home / Self Care | Payer: BLUE CROSS/BLUE SHIELD | Source: Ambulatory Visit

## 2023-08-26 VITALS — BP 148/84 | HR 75 | Temp 98.7°F | Resp 18 | Ht 66.0 in | Wt 202.0 lb

## 2023-08-26 DIAGNOSIS — M25511 Pain in right shoulder: Secondary | ICD-10-CM | POA: Diagnosis not present

## 2023-08-26 DIAGNOSIS — M25531 Pain in right wrist: Secondary | ICD-10-CM | POA: Diagnosis not present

## 2023-08-26 DIAGNOSIS — M25521 Pain in right elbow: Secondary | ICD-10-CM

## 2023-08-26 HISTORY — DX: Allergic rhinitis due to animal (cat) (dog) hair and dander: J30.81

## 2023-08-26 MED ORDER — PREDNISONE 20 MG PO TABS: 40.0000 mg | ORAL_TABLET | Freq: Every day | ORAL | 0 refills | Status: AC

## 2023-08-26 NOTE — ED Triage Notes (Signed)
(  Right arm) Shoulder elbow and wrist pain - Entered by patient  "This pain has been like this for a while, it started with my elbow, then wrist pain/swelling and now right shoulder". 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 OTC medications aren't helping now. "Progressively getting worse last 3-4 days". No PCP.

## 2023-08-26 NOTE — ED Provider Notes (Signed)
[name]    CSN: [id number] Arrival date & time: 08/26/23  1147      History   Chief Complaint Chief Complaint  Patient presents with   Arm Injury    HPI Nathan Hahn is a 52 y.o. male.   Patient here today for evaluation of what initially started as right elbow pain but then radiated to his wrist and his shoulder.  Patient has been ongoing for the last 3 to 4 days.  He notes symptoms seem to be worsening with time.  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  He has tried over-the-counter medication without resolution.  He denies any numbness or tingling.  The history is provided by the patient.  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 No pertinent surgical history.     Home Medications    Prior to Admission medications   Medication Sig Start Date End Date Taking? Authorizing Provider  indomethacin (INDOCIN SR) 75 MG CR capsule Take 75 mg by mouth 2 (two) times daily. 05/12/23  Yes [provider]  naproxen sodium (ALEVE) 220 MG tablet Take 440 mg by mouth daily as needed. Last dose: 12-3 am.   Yes [provider]  predniSONE (DELTASONE) 20 MG tablet Take 2 tablets (40 mg total) by mouth daily with breakfast for 5 days. 08/26/23 08/31/23 Yes Tomi Bamberger, PA-C  colchicine 0.6 MG tablet Take 2 tablets initially and then 1 additional tablet 1 hour later. 12/27/22   Ellsworth Lennox, PA-C  indomethacin (INDOCIN) 50 MG capsule Take 1 capsule (50 mg total) by  mouth 3 (three) times daily with meals. 11/14/22   Zenia Resides, MD  naproxen (NAPROSYN) 500 MG tablet Take 1 tablet (500 mg total) by mouth 2 (two) times daily. 12/27/22   Ellsworth Lennox, PA-C    Family History History reviewed. No pertinent family history.  Social History Social History   Tobacco Use   Smoking status: Never   Smokeless tobacco: Never  Vaping Use   Vaping status: Never Used  Substance Use Topics   Alcohol use: Not Currently   Drug use: Not Currently     Allergies   Cat hair extract   Review of Systems Review of Systems  Constitutional:  Negative for chills and fever.  Eyes:  Negative for discharge and redness.  Respiratory:  Negative for shortness of breath.   Musculoskeletal:  Positive for arthralgias. Negative for joint swelling.  Neurological:  Negative for numbness.     Physical Exam Triage Vital Signs ED Triage Vitals  Encounter Vitals Group     BP 08/26/23 1210 (!) 148/84     Systolic BP Percentile --      Diastolic BP Percentile --      Pulse Rate 08/26/23 1210 75     Resp 08/26/23 1210 18     Temp 08/26/23 1210 98.7 F (37.1 C)  Temp Source 08/26/23 1210 Oral     SpO2 08/26/23 1210 96 %     Weight 08/26/23 1208 202 lb (91.6 kg)     Height 08/26/23 1208 5\' 6"  (1.676 m)     Head Circumference --      Peak Flow --      Pain Score 08/26/23 1205 7     Pain Loc --      Pain Education --      Exclude from Growth Chart --    No data found.  Updated Vital Signs BP (!) 148/84 (BP Location: Left Arm) Comment: "in pain". Will recheck later during or after visit.  Pulse 75   Temp 98.7 F (37.1 C) (Oral)   Resp 18   Ht 5\' 6"  (1.676 m)   Wt 202 lb (91.6 kg)   SpO2 96%   BMI 32.60 kg/m   Visual Acuity Right Eye Distance:   Left Eye Distance:   Bilateral Distance:    Right Eye Near:   Left Eye Near:    Bilateral Near:     Physical Exam Vitals and nursing note reviewed.  Constitutional:      General: He is not in acute  distress.    Appearance: Normal appearance. He is not ill-appearing.  HENT:     Head: Normocephalic and atraumatic.  Eyes:     Conjunctiva/sclera: Conjunctivae normal.  Cardiovascular:     Rate and Rhythm: Normal rate.  Pulmonary:     Effort: Pulmonary effort is normal. No respiratory distress.  Musculoskeletal:     Comments: Decreased range of motion of right shoulder, right elbow, right wrist due to pain.  No significant swelling appreciated.  No erythema noted.  Skin:    Capillary Refill: Normal cap refill to right fingers Neurological:     Mental Status: He is alert.     Comments: Gross sensation intact to distal right fingers  Psychiatric:        Mood and Affect: Mood normal.        Behavior: Behavior normal.        Thought Content: Thought content normal.      UC Treatments / Results  Labs (all labs ordered are listed, but only abnormal results are displayed) Labs Reviewed - No data to display  EKG   Radiology No results found.  Procedures Procedures (including critical care time)  Medications Ordered in UC Medications - No data to display  Initial Impression / Assessment and Plan / UC Course  I have reviewed the triage vital signs and the nursing notes.  Pertinent labs & imaging results that were available during my care of the patient were reviewed by me and considered in my medical decision making (see chart for details).    Unclear etiology of symptoms, will treat with steroid burst and recommended follow-up with Ortho should symptoms persist.  Patient is agreeable to same.  He notes he normally uses [facility] and will follow-up there.  Final Clinical Impressions(s) / UC Diagnoses   Final diagnoses:  Acute pain of right shoulder  Right elbow pain  Right wrist pain   Discharge Instructions   None    ED Prescriptions     Medication Sig Dispense Auth. Provider   predniSONE (DELTASONE) 20 MG tablet Take 2 tablets (40 mg total) by mouth daily  with breakfast for 5 days. 10 tablet Tomi Bamberger, PA-C      PDMP not reviewed this encounter.   Tomi Bamberger, PA-C 08/26/23 1311

## 2024-07-21 ENCOUNTER — Other Ambulatory Visit: Payer: Self-pay | Admitting: Physician Assistant

## 2024-07-21 DIAGNOSIS — R7989 Other specified abnormal findings of blood chemistry: Secondary | ICD-10-CM

## 2024-08-16 ENCOUNTER — Ambulatory Visit
Admission: RE | Admit: 2024-08-16 | Discharge: 2024-08-16 | Disposition: A | Source: Ambulatory Visit | Attending: Physician Assistant | Admitting: Physician Assistant

## 2024-08-16 DIAGNOSIS — R7989 Other specified abnormal findings of blood chemistry: Secondary | ICD-10-CM
